# Patient Record
Sex: Male | Born: 1955 | Race: Black or African American | Hispanic: No | Marital: Single | Smoking: Former smoker
Health system: Southern US, Community
[De-identification: ages and names within clinical notes are randomized; demographics above are authoritative.]

## PROBLEM LIST (undated history)

## (undated) DIAGNOSIS — F419 Anxiety disorder, unspecified: Secondary | ICD-10-CM

## (undated) HISTORY — PX: APPENDECTOMY: SHX54

---

## 2008-11-02 ENCOUNTER — Emergency Department: Payer: Self-pay | Admitting: Emergency Medicine

## 2010-10-03 ENCOUNTER — Emergency Department: Payer: Self-pay | Admitting: Emergency Medicine

## 2012-07-21 ENCOUNTER — Inpatient Hospital Stay: Payer: Self-pay | Admitting: Surgery

## 2012-07-21 LAB — COMPREHENSIVE METABOLIC PANEL
Albumin: 3.3 g/dL — ABNORMAL LOW (ref 3.4–5.0)
Alkaline Phosphatase: 115 U/L (ref 50–136)
BUN: 19 mg/dL — ABNORMAL HIGH (ref 7–18)
Bilirubin,Total: 0.6 mg/dL (ref 0.2–1.0)
Calcium, Total: 9.7 mg/dL (ref 8.5–10.1)
Chloride: 97 mmol/L — ABNORMAL LOW (ref 98–107)
Co2: 29 mmol/L (ref 21–32)
Creatinine: 0.91 mg/dL (ref 0.60–1.30)
EGFR (Non-African Amer.): >60
Glucose: 153 mg/dL — ABNORMAL HIGH (ref 65–99)
Osmolality: 270 (ref 275–301)
Potassium: 2.9 mmol/L — ABNORMAL LOW (ref 3.5–5.1)
SGOT(AST): 20 U/L (ref 15–37)
SGPT (ALT): 69 U/L (ref 12–78)

## 2012-07-21 LAB — CBC
HCT: 41.7 % (ref 40.0–52.0)
HGB: 14.2 g/dL (ref 13.0–18.0)
MCH: 28.2 pg (ref 26.0–34.0)
MCHC: 34.2 g/dL (ref 32.0–36.0)
MCV: 83 fL (ref 80–100)
RBC: 5.04 10*6/uL (ref 4.40–5.90)
RDW: 14.8 % — ABNORMAL HIGH (ref 11.5–14.5)
WBC: 17.7 10*3/uL — ABNORMAL HIGH (ref 3.8–10.6)

## 2012-07-21 LAB — URINALYSIS, COMPLETE
Bilirubin,UR: NEGATIVE
Nitrite: NEGATIVE
Ph: 5 (ref 4.5–8.0)

## 2012-07-21 LAB — MAGNESIUM: Magnesium: 2.6 mg/dL — ABNORMAL HIGH

## 2012-07-21 LAB — TROPONIN I: Troponin-I: <0.02 ng/mL

## 2012-07-21 LAB — POTASSIUM: Potassium: 3.3 mmol/L — ABNORMAL LOW (ref 3.5–5.1)

## 2012-07-22 LAB — CBC WITH DIFFERENTIAL/PLATELET
Basophil %: 0.1 %
Eosinophil #: 0 10*3/uL (ref 0.0–0.7)
HCT: 41.2 % (ref 40.0–52.0)
Lymphocyte %: 7.6 %
MCH: 27.9 pg (ref 26.0–34.0)
MCV: 85 fL (ref 80–100)
Monocyte #: 0.7 x10 3/mm (ref 0.2–1.0)
Neutrophil #: 8.7 10*3/uL — ABNORMAL HIGH (ref 1.4–6.5)
Neutrophil %: 85.4 %
Platelet: 459 10*3/uL — ABNORMAL HIGH (ref 150–440)
RDW: 14.7 % — ABNORMAL HIGH (ref 11.5–14.5)

## 2012-07-22 LAB — BASIC METABOLIC PANEL
BUN: 15 mg/dL (ref 7–18)
Calcium, Total: 8.3 mg/dL — ABNORMAL LOW (ref 8.5–10.1)
Co2: 26 mmol/L (ref 21–32)
EGFR (Non-African Amer.): >60
Sodium: 138 mmol/L (ref 136–145)

## 2012-07-23 LAB — CBC WITH DIFFERENTIAL/PLATELET
Basophil %: 0.2 %
Eosinophil #: 0 10*3/uL (ref 0.0–0.7)
Eosinophil %: 0.1 %
HCT: 38.1 % — ABNORMAL LOW (ref 40.0–52.0)
HGB: 12.4 g/dL — ABNORMAL LOW (ref 13.0–18.0)
Lymphocyte #: 1.2 10*3/uL (ref 1.0–3.6)
Lymphocyte %: 7.8 %
MCV: 84 fL (ref 80–100)
Monocyte #: 1.1 x10 3/mm — ABNORMAL HIGH (ref 0.2–1.0)
Monocyte %: 7.2 %
Neutrophil #: 13.4 10*3/uL — ABNORMAL HIGH (ref 1.4–6.5)
RBC: 4.51 10*6/uL (ref 4.40–5.90)
RDW: 14.9 % — ABNORMAL HIGH (ref 11.5–14.5)
WBC: 15.8 10*3/uL — ABNORMAL HIGH (ref 3.8–10.6)

## 2012-07-23 LAB — BASIC METABOLIC PANEL
Calcium, Total: 8.5 mg/dL (ref 8.5–10.1)
Chloride: 101 mmol/L (ref 98–107)
EGFR (Non-African Amer.): >60
Osmolality: 277 (ref 275–301)
Potassium: 3.7 mmol/L (ref 3.5–5.1)
Sodium: 138 mmol/L (ref 136–145)

## 2012-07-25 LAB — CBC WITH DIFFERENTIAL/PLATELET
Basophil #: 0 10*3/uL (ref 0.0–0.1)
HCT: 34.6 % — ABNORMAL LOW (ref 40.0–52.0)
HGB: 11.5 g/dL — ABNORMAL LOW (ref 13.0–18.0)
Lymphocyte #: 1.2 10*3/uL (ref 1.0–3.6)
MCH: 27.9 pg (ref 26.0–34.0)
MCHC: 33.3 g/dL (ref 32.0–36.0)
MCV: 84 fL (ref 80–100)
Monocyte #: 0.7 x10 3/mm (ref 0.2–1.0)
Monocyte %: 5.1 %
Neutrophil #: 12.4 10*3/uL — ABNORMAL HIGH (ref 1.4–6.5)
Platelet: 359 10*3/uL (ref 150–440)
RBC: 4.13 10*6/uL — ABNORMAL LOW (ref 4.40–5.90)
WBC: 14.4 10*3/uL — ABNORMAL HIGH (ref 3.8–10.6)

## 2012-07-27 LAB — CBC WITH DIFFERENTIAL/PLATELET
Eosinophil #: 0.1 10*3/uL (ref 0.0–0.7)
HCT: 33.4 % — ABNORMAL LOW (ref 40.0–52.0)
HGB: 11.4 g/dL — ABNORMAL LOW (ref 13.0–18.0)
Lymphocyte %: 11.6 %
MCH: 28.5 pg (ref 26.0–34.0)
MCV: 84 fL (ref 80–100)
Monocyte #: 0.8 x10 3/mm (ref 0.2–1.0)
Monocyte %: 6.8 %
Neutrophil #: 9.5 10*3/uL — ABNORMAL HIGH (ref 1.4–6.5)
Platelet: 348 10*3/uL (ref 150–440)
RBC: 3.99 10*6/uL — ABNORMAL LOW (ref 4.40–5.90)
RDW: 14.5 % (ref 11.5–14.5)
WBC: 11.9 10*3/uL — ABNORMAL HIGH (ref 3.8–10.6)

## 2012-07-28 LAB — BASIC METABOLIC PANEL
Anion Gap: 7 (ref 7–16)
BUN: 6 mg/dL — ABNORMAL LOW (ref 7–18)
Calcium, Total: 8.6 mg/dL (ref 8.5–10.1)
Chloride: 101 mmol/L (ref 98–107)
Co2: 31 mmol/L (ref 21–32)
EGFR (African American): >60
Osmolality: 275 (ref 275–301)
Potassium: 3 mmol/L — ABNORMAL LOW (ref 3.5–5.1)
Sodium: 139 mmol/L (ref 136–145)

## 2012-07-28 LAB — CBC WITH DIFFERENTIAL/PLATELET
Basophil %: 0.4 %
HGB: 11.4 g/dL — ABNORMAL LOW (ref 13.0–18.0)
Lymphocyte %: 11.6 %
MCV: 83 fL (ref 80–100)
Monocyte #: 0.7 x10 3/mm (ref 0.2–1.0)
Monocyte %: 6 %
Neutrophil #: 9.6 10*3/uL — ABNORMAL HIGH (ref 1.4–6.5)
Neutrophil %: 81.7 %
Platelet: 376 10*3/uL (ref 150–440)
RBC: 4.09 10*6/uL — ABNORMAL LOW (ref 4.40–5.90)
WBC: 11.8 10*3/uL — ABNORMAL HIGH (ref 3.8–10.6)

## 2012-07-29 LAB — CLOSTRIDIUM DIFFICILE BY PCR

## 2012-07-30 LAB — CBC WITH DIFFERENTIAL/PLATELET
HCT: 32.8 % — ABNORMAL LOW (ref 40.0–52.0)
Lymphocyte #: 1.4 10*3/uL (ref 1.0–3.6)
Lymphocyte %: 16.8 %
MCH: 28.2 pg (ref 26.0–34.0)
Monocyte %: 7.6 %
Neutrophil #: 6 10*3/uL (ref 1.4–6.5)
Neutrophil %: 74 %
Platelet: 467 10*3/uL — ABNORMAL HIGH (ref 150–440)
RBC: 3.91 10*6/uL — ABNORMAL LOW (ref 4.40–5.90)
RDW: 14.8 % — ABNORMAL HIGH (ref 11.5–14.5)
WBC: 8.1 10*3/uL (ref 3.8–10.6)

## 2012-07-30 LAB — COMPREHENSIVE METABOLIC PANEL
Albumin: 2.3 g/dL — ABNORMAL LOW (ref 3.4–5.0)
Alkaline Phosphatase: 81 U/L (ref 50–136)
Bilirubin,Total: 0.5 mg/dL (ref 0.2–1.0)
Calcium, Total: 8.3 mg/dL — ABNORMAL LOW (ref 8.5–10.1)
Chloride: 102 mmol/L (ref 98–107)
Co2: 30 mmol/L (ref 21–32)
Creatinine: 0.68 mg/dL (ref 0.60–1.30)
EGFR (Non-African Amer.): >60
Glucose: 90 mg/dL (ref 65–99)
Osmolality: 279 (ref 275–301)
Potassium: 2.7 mmol/L — ABNORMAL LOW (ref 3.5–5.1)
SGOT(AST): 19 U/L (ref 15–37)

## 2012-07-31 LAB — BASIC METABOLIC PANEL
Chloride: 106 mmol/L (ref 98–107)
Co2: 28 mmol/L (ref 21–32)
Creatinine: 0.63 mg/dL (ref 0.60–1.30)
EGFR (African American): >60
EGFR (Non-African Amer.): >60
Glucose: 104 mg/dL — ABNORMAL HIGH (ref 65–99)
Potassium: 3.3 mmol/L — ABNORMAL LOW (ref 3.5–5.1)
Sodium: 142 mmol/L (ref 136–145)

## 2012-07-31 LAB — CBC WITH DIFFERENTIAL/PLATELET
Basophil #: 0 10*3/uL (ref 0.0–0.1)
HCT: 34.8 % — ABNORMAL LOW (ref 40.0–52.0)
Monocyte %: 6.7 %
Neutrophil #: 6.4 10*3/uL (ref 1.4–6.5)
Platelet: 549 10*3/uL — ABNORMAL HIGH (ref 150–440)

## 2012-08-03 LAB — CBC WITH DIFFERENTIAL/PLATELET
Basophil #: 0 10*3/uL (ref 0.0–0.1)
Basophil %: 0.5 %
Eosinophil #: 0.1 10*3/uL (ref 0.0–0.7)
Eosinophil %: 0.7 %
HCT: 31.7 % — ABNORMAL LOW (ref 40.0–52.0)
HGB: 10.6 g/dL — ABNORMAL LOW (ref 13.0–18.0)
Lymphocyte %: 12.4 %
MCH: 27.8 pg (ref 26.0–34.0)
MCHC: 33.3 g/dL (ref 32.0–36.0)
MCV: 84 fL (ref 80–100)
Monocyte %: 6.3 %
Neutrophil #: 7.6 10*3/uL — ABNORMAL HIGH (ref 1.4–6.5)
Neutrophil %: 80.1 %
Platelet: 530 10*3/uL — ABNORMAL HIGH (ref 150–440)
RDW: 14.5 % (ref 11.5–14.5)
WBC: 9.4 10*3/uL (ref 3.8–10.6)

## 2012-08-04 LAB — BASIC METABOLIC PANEL
Anion Gap: 7 (ref 7–16)
BUN: 2 mg/dL — ABNORMAL LOW (ref 7–18)
Calcium, Total: 8.2 mg/dL — ABNORMAL LOW (ref 8.5–10.1)
Co2: 31 mmol/L (ref 21–32)
Creatinine: 0.69 mg/dL (ref 0.60–1.30)
EGFR (African American): >60
EGFR (Non-African Amer.): >60
Glucose: 99 mg/dL (ref 65–99)

## 2012-08-04 LAB — MAGNESIUM: Magnesium: 1.3 mg/dL — ABNORMAL LOW

## 2012-08-05 LAB — URINALYSIS, COMPLETE
Bacteria: NONE SEEN
Bilirubin,UR: NEGATIVE
Glucose,UR: NEGATIVE mg/dL (ref 0–75)
Leukocyte Esterase: NEGATIVE
Nitrite: NEGATIVE
Protein: 30
RBC,UR: 6 /HPF (ref 0–5)
Specific Gravity: 1.014 (ref 1.003–1.030)

## 2012-08-05 LAB — BASIC METABOLIC PANEL
Anion Gap: 5 — ABNORMAL LOW (ref 7–16)
BUN: 2 mg/dL — ABNORMAL LOW (ref 7–18)
Chloride: 104 mmol/L (ref 98–107)
EGFR (African American): >60
EGFR (Non-African Amer.): >60
Glucose: 96 mg/dL (ref 65–99)
Osmolality: 277 (ref 275–301)
Potassium: 2.8 mmol/L — ABNORMAL LOW (ref 3.5–5.1)
Sodium: 141 mmol/L (ref 136–145)

## 2012-08-05 LAB — CLOSTRIDIUM DIFFICILE BY PCR

## 2012-08-05 LAB — PHOSPHORUS: Phosphorus: 2.2 mg/dL — ABNORMAL LOW (ref 2.5–4.9)

## 2012-08-06 LAB — CBC WITH DIFFERENTIAL/PLATELET
Basophil #: 0 10*3/uL (ref 0.0–0.1)
Basophil %: 1.1 %
Eosinophil #: 0.1 10*3/uL (ref 0.0–0.7)
Eosinophil %: 3 %
HCT: 33.5 % — ABNORMAL LOW (ref 40.0–52.0)
Lymphocyte #: 1.7 10*3/uL (ref 1.0–3.6)
Lymphocyte %: 35.7 %
MCHC: 33.8 g/dL (ref 32.0–36.0)
MCV: 84 fL (ref 80–100)
Monocyte #: 0.5 x10 3/mm (ref 0.2–1.0)
Neutrophil #: 2.3 10*3/uL (ref 1.4–6.5)
Neutrophil %: 48.7 %
Platelet: 566 10*3/uL — ABNORMAL HIGH (ref 150–440)
RBC: 4 10*6/uL — ABNORMAL LOW (ref 4.40–5.90)
WBC: 4.6 10*3/uL (ref 3.8–10.6)

## 2012-08-06 LAB — BASIC METABOLIC PANEL
Anion Gap: 6 — ABNORMAL LOW (ref 7–16)
BUN: 4 mg/dL — ABNORMAL LOW (ref 7–18)
Calcium, Total: 8.8 mg/dL (ref 8.5–10.1)
Co2: 29 mmol/L (ref 21–32)
EGFR (African American): >60
EGFR (Non-African Amer.): >60
Potassium: 3.3 mmol/L — ABNORMAL LOW (ref 3.5–5.1)
Sodium: 142 mmol/L (ref 136–145)

## 2012-08-15 ENCOUNTER — Ambulatory Visit: Payer: Self-pay | Admitting: Surgery

## 2012-08-15 LAB — BASIC METABOLIC PANEL WITH GFR
Anion Gap: 11
BUN: 10 mg/dL
Calcium, Total: 9.6 mg/dL
Chloride: 103 mmol/L
Co2: 32 mmol/L
Creatinine: 0.79 mg/dL
EGFR (African American): >60
EGFR (Non-African Amer.): >60
Glucose: 101 mg/dL — ABNORMAL HIGH
Osmolality: 290
Potassium: 2.8 mmol/L — ABNORMAL LOW
Sodium: 146 mmol/L — ABNORMAL HIGH

## 2012-08-15 LAB — CBC WITH DIFFERENTIAL/PLATELET
Basophil #: 0 10*3/uL (ref 0.0–0.1)
Basophil %: 0.3 %
Eosinophil %: 1.2 %
HGB: 12.1 g/dL — ABNORMAL LOW (ref 13.0–18.0)
Lymphocyte #: 1.6 10*3/uL (ref 1.0–3.6)
Lymphocyte %: 23.7 %
MCH: 27.8 pg (ref 26.0–34.0)
MCHC: 33.1 g/dL (ref 32.0–36.0)
MCV: 84 fL (ref 80–100)
Monocyte %: 7.9 %
Platelet: 441 10*3/uL — ABNORMAL HIGH (ref 150–440)
RDW: 14.9 % — ABNORMAL HIGH (ref 11.5–14.5)
WBC: 6.8 10*3/uL (ref 3.8–10.6)

## 2012-08-19 ENCOUNTER — Ambulatory Visit: Payer: Self-pay | Admitting: Surgery

## 2014-03-12 ENCOUNTER — Ambulatory Visit: Payer: Self-pay

## 2014-06-01 NOTE — Op Note (Signed)
PATIENT NAME:  William RichterJEFFERS, Tallin L MR#:  [id number] DATE OF BIRTH:  Nov 22, 1955  DATE OF PROCEDURE:  07/21/2012  PREOPERATIVE DIAGNOSIS:  Acute appendicitis.   POSTOPERATIVE DIAGNOSIS:  Acute ruptured appendicitis.   SURGEON:  Quentin Orealph L. Ely, M.D.   ANESTHESIA:  General.   OPERATIVE PROCEDURE:  Laparoscopic appendectomy.   PROCEDURE IN DETAIL:  With the patient in supine position after induction of appropriate general anesthesia, the patient's abdomen was prepped with ChloraPrep and draped with sterile towels.  The patient was placed in the head down, feet up position.  A very small infraumbilical incision was made in the standard fashion and carried down bluntly through the subcutaneous tissue.  A Veress needle was used to cannulate the peritoneal cavity.  CO2 was insufflated to appropriate pressure measurements.  When approximately 2.5 liters of CO2 were instilled, the Veress needle was withdrawn.  An 11 mm [employer] ports inserted into the peritoneal cavity.  The appropriate position was confirmed.  CO2 was reinsufflated.  The patient was placed in the head down, feet up position and a transverse upper abdominal incision was made and an 11 mm port was inserted under direct vision.  The right lower quadrant was investigated.  There was a large abscess which was encountered in the right lower quadrant.  A transverse incision was made and a 12 mm port was inserted under direct vision.  The camera was moved to the upper port, dissection carried out through the two lower ports.  The area was copiously irrigated and suctioned.  A large amount of purulence was encountered.  Tedious dissection was required to expose the appendix which appeared to have ruptured at its base shearing off the majority of the appendix.  A portion of the mesoappendix was identified and divided with the Endo GIA stapler carrying a white load and then the base of the appendix was visualized.  It was very difficult to say in the  debris exactly where the stump was, but I stapled across what I believed to be the base of the appendix.  The appendix was captured in an Endo Catch apparatus and removed.  The area was then irrigated with 5 liters of warm saline solution.  A separate stab was made in the right lower quadrant and a JP drain inserted, placed in the gutter and brought out through that incision.  It was secured with 3-0 nylon.  The abdomen was desufflated.  The midline fascia was closed with figure-of-eight suture of 0 Vicryl and skin was closed with 5-0 nylon.  The area was infiltrated with 0.25% Marcaine for postoperative pain control.  Sterile dressings were applied.  The patient was returned to the recovery room having tolerated the procedure well.  Sponge, instrument and needle counts were correct x 2 in the operating room.     ____________________________ Quentin Orealph L. Ely III, MD rle:ea D: 07/22/2012 00:48:00 ET T: 07/22/2012 04:20:12 ET JOB#: [id number]  cc: Quentin Orealph L. Ely III, MD, <Dictator> Quentin OreALPH L ELY MD ELECTRONICALLY SIGNED 07/29/2012 20:12

## 2014-06-01 NOTE — Discharge Summary (Signed)
PATIENT NAME:  William Banks, William Banks MR#:  [id number] DATE OF BIRTH:  12-04-1955  DATE OF ADMISSION:  07/21/2012 DATE OF DISCHARGE:  08/07/2012  BRIEF HISTORY: Mr. Maudry MayhewDallas Resnik is a 59 year old gentleman admitted through the Emergency Room with what appeared to be a ruptured appendicitis. The patient had been having symptoms for several days at the time of admission. He was taken to surgery that evening where he underwent surgical laparoscopy. He had acute ruptured appendicitis with a large pocket of purulence and a necrotic appendix. It was very difficult to identify the stump of the appendix, but I believe I stapled the base of the appendix. The appendix was removed. The abdomen irrigated with 5 liters of warm saline solution. A JP drain was placed in the gutter along the right colon. He had very slow return of bowel function with a prolonged ileus. He began to develop some drainage from the JP drain which appeared to be feculent, and there was concern about the development of a fecal fistula. The drain was left in place. He slowly began to recover his bowel function. He was able to tolerate a regular diet. The drainage cleared to more purulent looking material and the patient was prepared for discharge. He did have some liquid stool prior to discharge and a Clostridium difficile titer was negative. He was discharged home on the 29th to be followed in the office in 7 to 10 days' time for drain removal. His discharge medications include Augmentin 875 twice a day for five days, Vicodin every six hours 5/325 p.r.n. and  potassium 20 mEq once a day.   FINAL DISCHARGE DIAGNOSIS: Acute ruptured appendicitis.   SURGERY: Laparoscopic appendectomy.  ____________________________ Quentin Orealph Banks. Ely III, MD rle:dp D: 08/23/2012 03:55:00 ET T: 08/23/2012 07:03:56 ET JOB#: [id number]  cc: Carmie Endalph Banks. Ely III, MD, <Dictator> Quentin OreALPH Banks ELY MD ELECTRONICALLY SIGNED 08/23/2012 19:37

## 2014-06-01 NOTE — H&P (Signed)
Subjective/Chief Complaint lower abd pain   History of Present Illness 2-3 day Hx of bilat lower quadrant pain, nausea, emesis; nml BM no melena, no hematochezia no dysuria ate today about 10 am no f/c no prior episode   Past History none PSH inknown umbilical? procedure as baby ?hernia?   Past Med/Surgical Hx:  denies:   denies:   ALLERGIES:  No Known Allergies:   Family and Social History:  Family History Non-Contributory   Social History negative tobacco, positive ETOH, stopped tobacco years ago; not employed   Warden/ranger of [employer]   Review of Systems:  Fever/Chills No   Cough No   Abdominal Pain Yes   Diarrhea No   Constipation No   Nausea/Vomiting Yes   SOB/DOE No   Chest Pain No   Dysuria No   Tolerating Diet No  Nauseated  Vomiting  but ate a sandwich this am   Medications/Allergies Reviewed Medications/Allergies reviewed   Physical Exam:  GEN disheveled, dry, fetid breath   HEENT pink conjunctivae   NECK supple   RESP normal resp effort  clear BS   CARD regular rate   ABD positive tenderness  soft  distended  tender bilat LQ max in RLQ/McBurney's point; guarding, perc tenderness   LYMPH negative neck   EXTR negative edema   SKIN normal to palpation   PSYCH alert, A+O to time, place, person   Lab Results: Hepatic:  12-Jun-14 08:47   Bilirubin, Total 0.6  Alkaline Phosphatase 115  SGPT (ALT) 69  SGOT (AST) 20  Total Protein, Serum  9.1  Albumin, Serum  3.3  Routine Chem:  12-Jun-14 08:47   Magnesium, Serum  2.6 (1.8-2.4 THERAPEUTIC RANGE: 4-7 mg/dL TOXIC: > 10 mg/dL  -----------------------)  Glucose, Serum  153  BUN  19  Creatinine (comp) 0.91  Sodium, Serum  132  Potassium, Serum  2.9  Chloride, Serum  97  CO2, Serum 29  Calcium (Total), Serum 9.7  Osmolality (calc) 270  eGFR (African American) >60  eGFR (Non-African American) >60 (eGFR values <55m/min/1.73 m2 may be an indication of chronic kidney disease  (CKD). Calculated eGFR is useful in patients with stable renal function. The eGFR calculation will not be reliable in acutely ill patients when serum creatinine is changing rapidly. It is not useful in  patients on dialysis. The eGFR calculation may not be applicable to patients at the low and high extremes of body sizes, pregnant women, and vegetarians.)  Anion Gap  6  Lipase 128 (Result(s) reported on 21 Jul 2012 at 09:13AM.)  Cardiac:  12-Jun-14 08:47   Troponin I < 0.02 (0.00-0.05 0.05 ng/mL or less: NEGATIVE  Repeat testing in 3-6 hrs  if clinically indicated. >0.05 ng/mL: POTENTIAL  MYOCARDIAL INJURY. Repeat  testing in 3-6 hrs if  clinically indicated. NOTE: An increase or decrease  of 30% or more on serial  testing suggests a  clinically important change)  Routine UA:  12-Jun-14 08:47   Color (UA) Amber  Clarity (UA) Hazy  Glucose (UA) Negative  Bilirubin (UA) Negative  Ketones (UA) Trace  Specific Gravity (UA) 1.028  Blood (UA) 2+  pH (UA) 5.0  Protein (UA) 100 mg/dL  Nitrite (UA) Negative  Leukocyte Esterase (UA) Negative (Result(s) reported on 21 Jul 2012 at 09:58AM.)  RBC (UA) 24 /HPF  WBC (UA) 4 /HPF  Bacteria (UA) TRACE  Epithelial Cells (UA) NONE SEEN  Mucous (UA) PRESENT (Result(s) reported on 21 Jul 2012 at 09:58AM.)  Routine Hem:  12-Jun-14 08:47   WBC (CBC)  17.7  RBC (CBC) 5.04  Hemoglobin (CBC) 14.2  Hematocrit (CBC) 41.7  Platelet Count (CBC)  554 (Result(s) reported on 21 Jul 2012 at 09:16AM.)  MCV 83  MCH 28.2  MCHC 34.2  RDW  14.8   Radiology Results: CT:    12-Jun-14 12:46, CT Abdomen and Pelvis With Contrast  CT Abdomen and Pelvis With Contrast  REASON FOR EXAM:    (1) LLQ pain, leukocytosis; (2) LLQ pain, leukocytosis  COMMENTS:       PROCEDURE: CT  - CT ABDOMEN / PELVIS  W  - Jul 21 2012 12:46PM     RESULT: Axial CT scanning was performed through the abdomen and pelvis   following intravenous administration of 100 cc of  Isovue-370 as well as   oral contrast material. Review of multiplanar reconstructed images was   performed separately on the VIA monitor.    There is moderate distention of loops of small bowel with gas and fluid.   This appears to be secondary to a process in the right lower quadrant of   the abdomen. There is increased density in the pericolonic fat and poor   definition of the bowel wall. There is a structure that may reflect an   inflamed appendix demonstrated on images 82 through 96. There appears to     be a small amount of extraluminal fluid just above the presumably   inflamed appendix. The ascending and transverse colon portions of the   colon contains some gas no significant fluid. The descending colonis   largely collapsed. The rectosigmoid contains a moderate amount of gas and   some liquid stool is noted in the rectum.     The gallbladder is contracted. The liver, spleen, moderately distended   stomach, pancreas, and adrenal glands are normal in appearance. There are   multiple hypodensities in the left kidney most compatible with cysts.   Neither kidney exhibits evidence of stones or obstruction. The caliber of   the abdominal aorta is normal. The partially distended urinary bladder is   normal in appearance. The prostate gland is enlarged and produces a mild   impression upon the urinary bladder base. There is no significant   inguinal hernia. There is a broadly necked umbilical hernia. The lumbar   vertebral bodies are preserved in height. The lung bases exhibit no     infiltrates. There is minimal pleural thickening on the left.    IMPRESSION:   1. There is an inflammatory process in the right lower quadrant of the   abdomen and upper pelvis. This may reflect acute appendicitis but focal   diverticulitis could present in this fashion in the appropriate clinical   setting. A definite mass is not demonstrated here. There may be contained   perforation but portions of  the intact appendix are demonstrated as well.   An appendicolith is likely present on image 63.  2. The small bowel is moderately distended with gas and fluid in a   fashion consistent with an ileus or fairly high-grade distal obstruction.  3. The remainder of the colon exhibits a relatively nonspecific gas   pattern. There is some liquid stool in the rectum.  4. There is no acute hepatobiliary abnormality. The gallbladder is   contracted.  5. There are likely cysts in the left kidney but there is no evidence of   urinary tract obstruction.    The findingswere discussed by me by telephone with Dr.  Jasmine December in the   emergency department at the conclusion of the study.     Dictation Site: 1        Verified By: DAVID A. Martinique, M.D., MD    Assessment/Admission Diagnosis CT personally rev'd; suspect that pt has ruptured appendix with fluid in retroperitoneal position. Abn electrolytes need replacement and pt ate this am. n/v. will admit, start abx and aggressively fluid resuscitate and replace K 2.9. will need laparoscopy possible open appy discussed options risks and rationale agrees with plan   Electronic Signatures: Florene Glen (MD)  (Signed 12-Jun-14 16:06)  Authored: CHIEF COMPLAINT and HISTORY, PAST MEDICAL/SURGIAL HISTORY, ALLERGIES, FAMILY AND SOCIAL HISTORY, REVIEW OF SYSTEMS, PHYSICAL EXAM, LABS, Radiology, ASSESSMENT AND PLAN   Last Updated: 12-Jun-14 16:06 by Florene Glen (MD)

## 2014-06-01 NOTE — H&P (Signed)
PATIENT NAME:  William Banks, William Banks MR#:  [id number] DATE OF BIRTH:  02-09-1956  DATE OF ADMISSION:  07/21/2012  CHIEF COMPLAINT: Lower abdominal pain.   HISTORY OF PRESENT ILLNESS: This is a patient with 2 to 3 day history of lower abdominal pain. He has been vague or difficult to determine where this pain started. He had told the triage nurse that was on the left and he told the ER doctor that it was on the right, but on questioning and on exam his pain his in both lower quadrants, maximal in the right side.   He vomited multiple times yesterday. He has had no fevers or chills. His pain is gradually worsening. He denies back pain and has had no dysuria, melena or hematochezia. Has never had an episode like this before. He ate a sandwich this morning approximately, at 10:00 a.m. in spite of been nauseated and vomiting yesterday.   PAST MEDICAL HISTORY: None, although the patient does not see physicians.   PAST SURGICAL HISTORY: Some sort of umbilical procedure performed as a baby, suspect umbilical herniorrhaphy as a baby.    ALLERGIES: None.   MEDICATIONS: None.   FAMILY HISTORY: Noncontributory.   SOCIAL HISTORY: The patient is currently unemployed. 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 He does not smoke. Has not smoked for several years. He does drink alcohol on weekends.   REVIEW OF SYSTEMS:  A 10-system review was performed and negative with the exception of that mentioned in the history of present illness.   PHYSICAL EXAMINATION: GENERAL: In general the patient appears disheveled. He has a fetid breath.   VITAL SIGNS: Temperature is 98.4, pulse 90, respirations 18, blood pressure 130/78, 96% room air sat. Percentage and pain scale of 5.  HEENT: No scleral icterus.  NECK: No palpable neck nodes.  CHEST: Clear to auscultation. Minimal rhonchi at both bases.  CARDIAC: Regular rate and rhythm.  ABDOMEN: Obese, distended, tympanitic, tender in both lower quadrants, maximal in the right  lower quadrant with guarding and percussion tenderness and some rebound tenderness in the right lower quadrant. He  also has some tenderness in the left lower quadrant, but more in the right.  EXTREMITIES: Without edema.  NEUROLOGIC: Grossly intact.  INTEGUMENT: No jaundice.   LABORATORY VALUES: Demonstrate a white blood cell count is 17.7, hemoglobin and hematocrit of 14 and 42 and a platelet count of 554.   Electrolytes show a normal lipase, serum magnesium of 2.6, glucose of 153, BUN 19, creatinine 0.91, sodium is 132, potassium of 2.9, chloride of 97, CO2 of 29 and an albumin of 3.3.   CT scan is personally reviewed. In my opinion, the patient likely has a ruptured appendix. The reading suggests that there is an inflammatory process in the right lower quadrant, which may reflect acute appendicitis; however, in my review, it appears that there is fluid in the area behind the cecum. There intense inflammation in this area and it extends up the paracolic gutter suggestive of probable ruptured appendix.   ASSESSMENT AND PLAN: This is a patient with a probable ruptured appendix, although this could be simple appendicitis in the sense that the patient has only been sick for 2 days, but my feeling is that this patient may have been ill longer. He has a very fetid breath and appears dry. He vomited yesterday, ate today and has a grossly abnormal electrolytes. My plan is to admit the patient to the hospital, start aggressive fluid resuscitation and IV antibiotics and take  him to the operating room later this afternoon or evening, once he is adequately resuscitated. I will replace his potassium as well.  This plan was discussed with him. He understood and agreed to proceed. We also discussed the options and rationale and the risks of bleeding, infection and conversion to an open procedure from a laparoscopic approach and the  risk of recurrent abscess and the fact that this is likely were already ruptured. He  understood and agreed to proceed.    ____________________________ Adah Salvageichard E. Excell Seltzerooper, MD rec:cc D: 07/21/2012 16:12:31 ET T: 07/21/2012 16:38:47 ET JOB#: [id number]  cc: Adah Salvageichard E. Excell Seltzerooper, MD, <Dictator> Lattie HawICHARD E Acquanetta Cabanilla MD ELECTRONICALLY SIGNED 07/23/2012 11:22

## 2019-12-29 ENCOUNTER — Other Ambulatory Visit: Payer: Self-pay

## 2019-12-29 ENCOUNTER — Encounter: Payer: Self-pay | Admitting: Emergency Medicine

## 2019-12-29 ENCOUNTER — Emergency Department: Payer: Self-pay

## 2019-12-29 ENCOUNTER — Emergency Department
Admission: EM | Admit: 2019-12-29 | Discharge: 2019-12-29 | Disposition: A | Discharge status: Home / Self Care | Payer: Self-pay | Attending: Emergency Medicine | Admitting: Emergency Medicine

## 2019-12-29 DIAGNOSIS — Z87891 Personal history of nicotine dependence: Secondary | ICD-10-CM | POA: Insufficient documentation

## 2019-12-29 DIAGNOSIS — R109 Unspecified abdominal pain: Secondary | ICD-10-CM | POA: Insufficient documentation

## 2019-12-29 DIAGNOSIS — R110 Nausea: Secondary | ICD-10-CM | POA: Insufficient documentation

## 2019-12-29 DIAGNOSIS — R1084 Generalized abdominal pain: Secondary | ICD-10-CM

## 2019-12-29 DIAGNOSIS — K5900 Constipation, unspecified: Secondary | ICD-10-CM | POA: Insufficient documentation

## 2019-12-29 HISTORY — DX: Anxiety disorder, unspecified: F41.9

## 2019-12-29 LAB — COMPREHENSIVE METABOLIC PANEL
ALT: 21 U/L (ref 0–44)
AST: 16 U/L (ref 15–41)
Albumin: 4.2 g/dL (ref 3.5–5.0)
Alkaline Phosphatase: 71 U/L (ref 38–126)
Anion gap: 12 (ref 5–15)
BUN: 14 mg/dL (ref 8–23)
CO2: 25 mmol/L (ref 22–32)
Calcium: 9.2 mg/dL (ref 8.9–10.3)
Chloride: 102 mmol/L (ref 98–111)
Creatinine, Ser: 0.78 mg/dL (ref 0.61–1.24)
GFR, Estimated: >60 mL/min (ref >60)
Glucose, Bld: 111 mg/dL — ABNORMAL HIGH (ref 70–99)
Potassium: 3.4 mmol/L — ABNORMAL LOW (ref 3.5–5.1)
Sodium: 139 mmol/L (ref 135–145)
Total Bilirubin: 0.5 mg/dL (ref 0.3–1.2)
Total Protein: 8.1 g/dL (ref 6.5–8.1)

## 2019-12-29 LAB — URINALYSIS, COMPLETE (UACMP) WITH MICROSCOPIC
Bacteria, UA: NONE SEEN
Bilirubin Urine: NEGATIVE
Glucose, UA: NEGATIVE mg/dL
Ketones, ur: NEGATIVE mg/dL
Leukocytes,Ua: NEGATIVE
Nitrite: NEGATIVE
Protein, ur: 30 mg/dL — AB
Specific Gravity, Urine: 1.026 (ref 1.005–1.030)
pH: 5 (ref 5.0–8.0)

## 2019-12-29 LAB — CBC
HCT: 42.5 % (ref 39.0–52.0)
Hemoglobin: 13.9 g/dL (ref 13.0–17.0)
MCH: 28.7 pg (ref 26.0–34.0)
MCHC: 32.7 g/dL (ref 30.0–36.0)
MCV: 87.6 fL (ref 80.0–100.0)
Platelets: 419 10*3/uL — ABNORMAL HIGH (ref 150–400)
RBC: 4.85 MIL/uL (ref 4.22–5.81)
RDW: 14.3 % (ref 11.5–15.5)
WBC: 6.2 10*3/uL (ref 4.0–10.5)
nRBC: 0 % (ref 0.0–0.2)

## 2019-12-29 LAB — LIPASE, BLOOD: Lipase: 29 U/L (ref 11–51)

## 2019-12-29 MED ORDER — IOHEXOL 300 MG/ML  SOLN
100.0000 mL | Freq: Once | INTRAMUSCULAR | Status: AC | PRN
  Administered 2019-12-29: 100 mL via INTRAVENOUS

## 2019-12-29 MED ORDER — IOHEXOL 9 MG/ML PO SOLN
500.0000 mL | Freq: Once | ORAL | Status: AC
  Administered 2019-12-29: 500 mL via ORAL

## 2019-12-29 MED ORDER — POLYETHYLENE GLYCOL 3350 17 GM/SCOOP PO POWD: 17.0000 g | Freq: Every day | ORAL | 0 refills | Status: AC | PRN

## 2019-12-29 MED ORDER — DICYCLOMINE HCL 10 MG PO CAPS: 10.0000 mg | ORAL_CAPSULE | Freq: Three times a day (TID) | ORAL | 0 refills | Status: AC | PRN

## 2019-12-29 NOTE — ED Provider Notes (Signed)
[facility] Emergency Department Provider Note  Time seen: 8:23 AM  I have reviewed the triage vital signs and the nursing notes.   HISTORY  Chief Complaint Abdominal Pain   HPI William Banks is a 64 y.o. male with a past medical history of anxiety presents to the emergency department for abdominal pain.  According to the patient since yesterday he has been experiencing vague abdominal discomfort throughout his abdomen, he is also feeling like he needs to have a bowel movement but is unable to do so, states nausea but denies vomiting.  No fever.  Patient states his abdomen feels like it is distended since yesterday as well.  He is concerned that he could have a "bowel blockage."   Past Medical History:  Diagnosis Date  . Anxiety     There are no problems to display for this patient.   Prior to Admission medications   Not on File    Allergies  Allergen Reactions  . Bee Venom Swelling    No family history on file.  Social History Social History   Tobacco Use  . Smoking status: Former Games developer  . Smokeless tobacco: Never Used  Substance Use Topics  . Alcohol use: Not on file  . Drug use: Not on file    Review of Systems Constitutional: Negative for fever Cardiovascular: Negative for chest pain. Respiratory: Negative for shortness of breath. Gastrointestinal: Mild dull vague abdominal discomfort.  Positive for nausea.  Negative for vomiting or diarrhea.  Positive for constipation since yesterday. Genitourinary: Negative for urinary compaints Musculoskeletal: Negative for musculoskeletal complaints Neurological: Negative for headache All other ROS negative  ____________________________________________   PHYSICAL EXAM:  VITAL SIGNS: ED Triage Vitals  Enc Vitals Group     BP 12/29/19 0733 (!) 152/82     Pulse Rate 12/29/19 0733 71     Resp 12/29/19 0733 18     Temp 12/29/19 0733 98.9 F (37.2 C)     Temp Source 12/29/19 0733 Oral      SpO2 12/29/19 0733 97 %     Weight 12/29/19 0734 215 lb (97.5 kg)     Height 12/29/19 0734 5\' 7"  (1.702 m)     Head Circumference --      Peak Flow --      Pain Score 12/29/19 0738 5     Pain Loc --      Pain Edu? --      Excl. in GC? --    Constitutional: Alert and oriented. Well appearing and in no distress. Eyes: Normal exam ENT      Head: Normocephalic and atraumatic.      Mouth/Throat: Mucous membranes are moist. Cardiovascular: Normal rate, regular rhythm. Respiratory: Normal respiratory effort without tachypnea nor retractions. Breath sounds are clear  Gastrointestinal: Soft, mild distention but overall dull percussion.  Very slight but diffuse abdominal tenderness without focal tenderness identified.  No rebound guarding. Musculoskeletal: Nontender with normal range of motion in all extremities. Neurologic:  Normal speech and language. No gross focal neurologic deficits  Skin:  Skin is warm, dry and intact.  Psychiatric: Mood and affect are normal.    ____________________________________________   RADIOLOGY  CT negative for acute abnormality  ____________________________________________   INITIAL IMPRESSION / ASSESSMENT AND PLAN / ED COURSE  Pertinent labs & imaging results that were available during my care of the patient were reviewed by me and considered in my medical decision making (see chart for details).   Patient presents to  the emergency department for vague abdominal discomfort more so in the lower abdomen.  Also states constipation since yesterday.  Patient's lab work is largely within normal limits besides slight hematuria however given the patient's complaint of abdominal distention constipation and mild tenderness throughout we will obtain a CT scan to further evaluate.  Patient agreeable to plan of care.  CT scan is negative for acute abnormality.  Will discharge on MiraLAX and Bentyl I will have the patient follow-up with his doctor.  Patient agreeable  to plan of care.  William Banks was evaluated in Emergency Department on 12/29/2019 for the symptoms described in the history of present illness. He was evaluated in the context of the global COVID-19 pandemic, which necessitated consideration that the patient might be at risk for infection with the SARS-CoV-2 virus that causes COVID-19. Institutional protocols and algorithms that pertain to the evaluation of patients at risk for COVID-19 are in a state of rapid change based on information released by regulatory bodies including the CDC and federal and state organizations. These policies and algorithms were followed during the patient's care in the ED.  ____________________________________________   FINAL CLINICAL IMPRESSION(S) / ED DIAGNOSES  Abdominal pain Constipation   Minna Antis, MD 12/29/19 1059

## 2019-12-29 NOTE — ED Triage Notes (Signed)
Pt to ER states he could not use the bathroom today. Pt states he has concerns that he has a blocked bowel.  Pt reports last BM was yesterday, but does not feel like it was a complete BM.

## 2019-12-29 NOTE — ED Notes (Signed)
Patient alert and oriented. Ambulatory with no issues. Ready for DC verbalized understanding.

## 2019-12-29 NOTE — ED Notes (Signed)
Patient transported to CT 

## 2019-12-29 NOTE — ED Triage Notes (Signed)
First RN Note: pt to ED via [facility] with c/o constipation, per EMS pt "could not poop to his fullest potential", EMS also reports 1 episode of vomiting with very light blood noted in blood.   HR 80 NSR 160/80 98% RA

## 2021-05-28 DIAGNOSIS — H401133 Primary open-angle glaucoma, bilateral, severe stage: Secondary | ICD-10-CM | POA: Diagnosis not present

## 2021-05-28 DIAGNOSIS — H2513 Age-related nuclear cataract, bilateral: Secondary | ICD-10-CM | POA: Diagnosis not present

## 2021-06-21 IMAGING — CT CT ABD-PELV W/ CM
2 of 5 series · 17 of 46 positions shown, 19 images · IV contrast (omnipaque)
Comparison: None.

CLINICAL DATA: Abdominal pain. No bowel movement. Concern for bowel
obstruction.

EXAM:
CT ABDOMEN AND PELVIS WITH CONTRAST
TECHNIQUE: Multidetector CT imaging of the abdomen and pelvis was performed
using the standard protocol following bolus administration of
intravenous contrast.
CONTRAST:  100mL OMNIPAQUE IOHEXOL 300 MG/ML  SOLN

[Series 2: routine abd/pel with · axial · 0.97mm/px · z∈[-695,-315]mm · 14 of 88 slices shown, 16 images]
[im 6/88  soft-tissue]
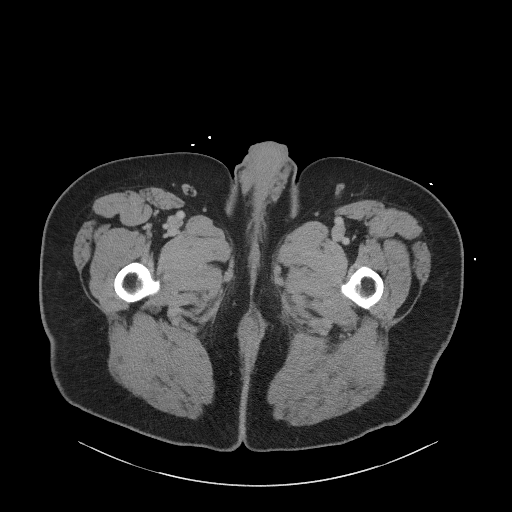
[im 6/88  bone]
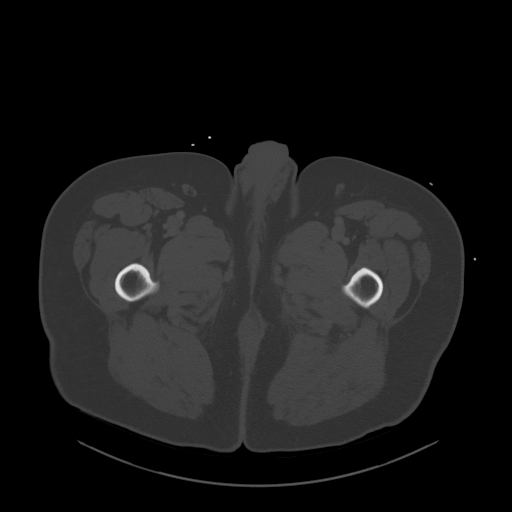
[im 11/88  soft-tissue]
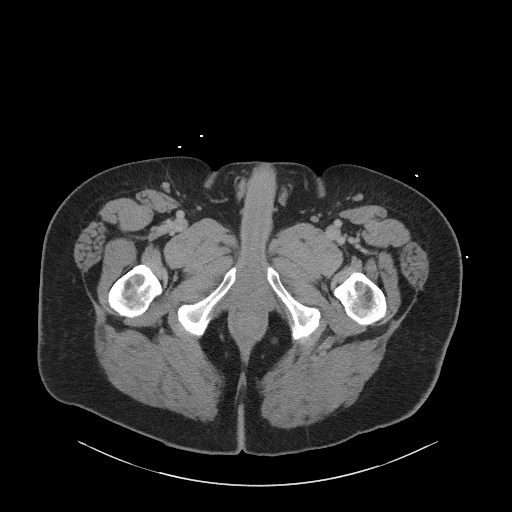
[im 16/88  soft-tissue]
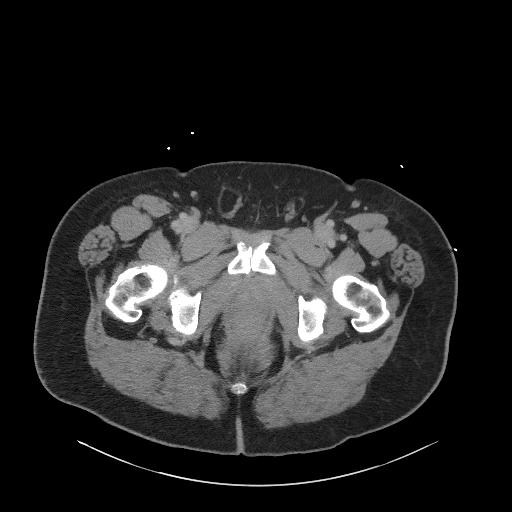
[im 26/88  soft-tissue]
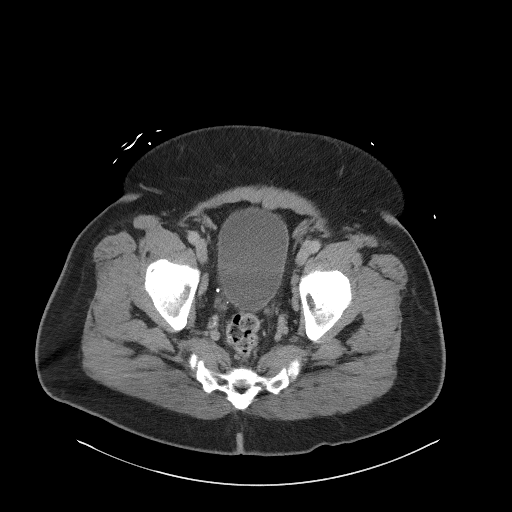
[im 31/88  soft-tissue]
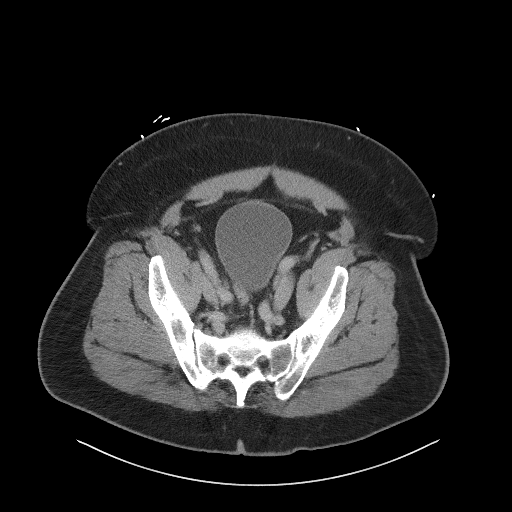
[im 36/88  soft-tissue]
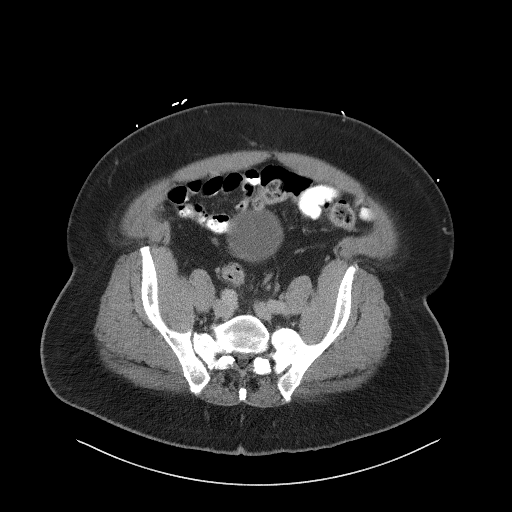
[im 41/88  soft-tissue]
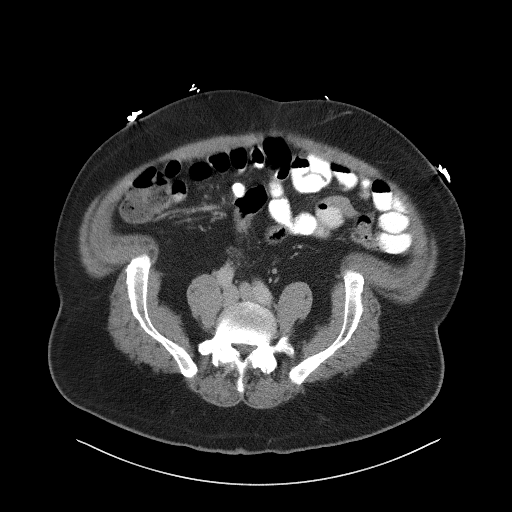
[im 47/88  soft-tissue]
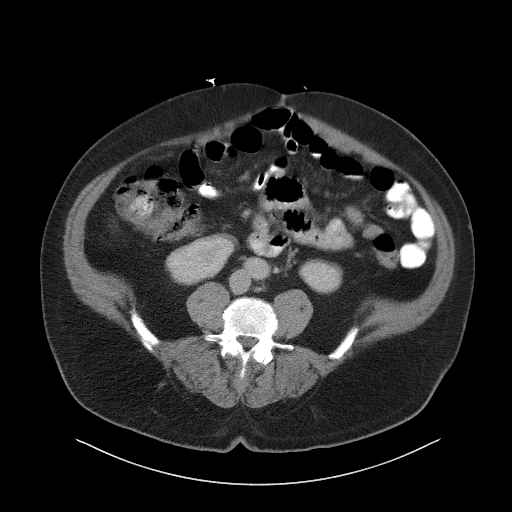
[im 52/88  soft-tissue]
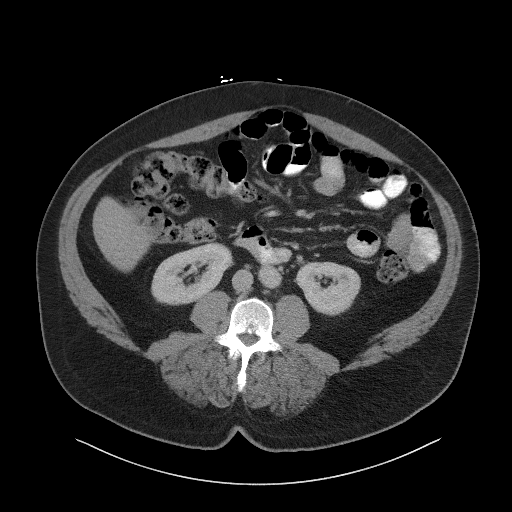
[im 52/88  bone]
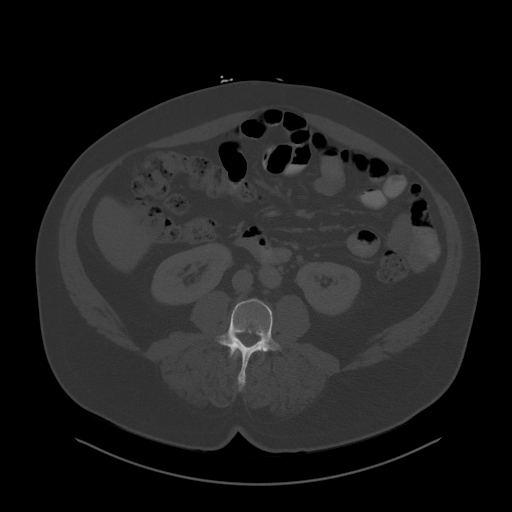
[im 57/88  soft-tissue]
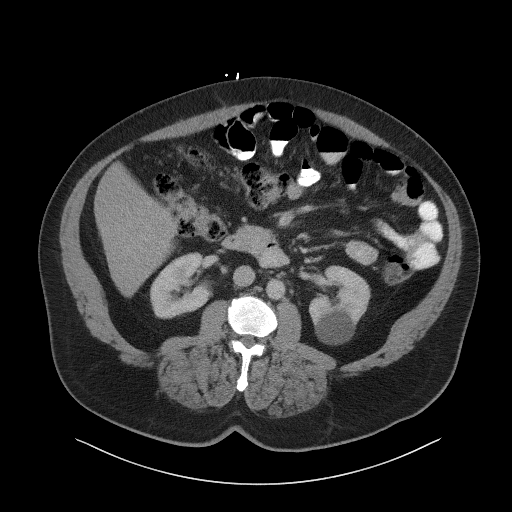
[im 67/88  soft-tissue]
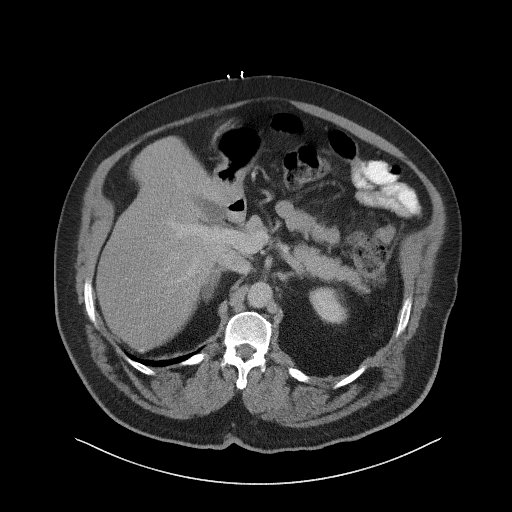
[im 72/88  soft-tissue]
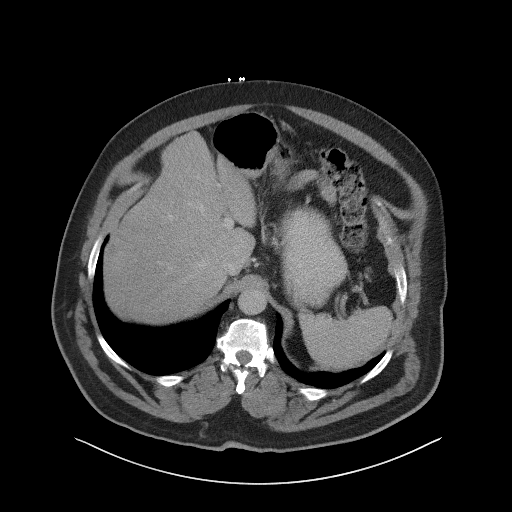
[im 77/88  soft-tissue]
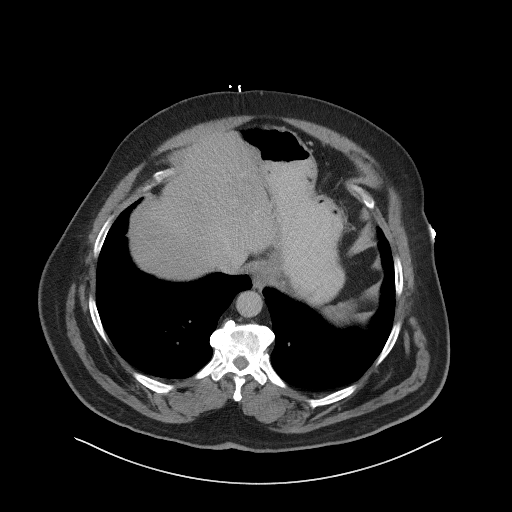
[im 82/88  soft-tissue]
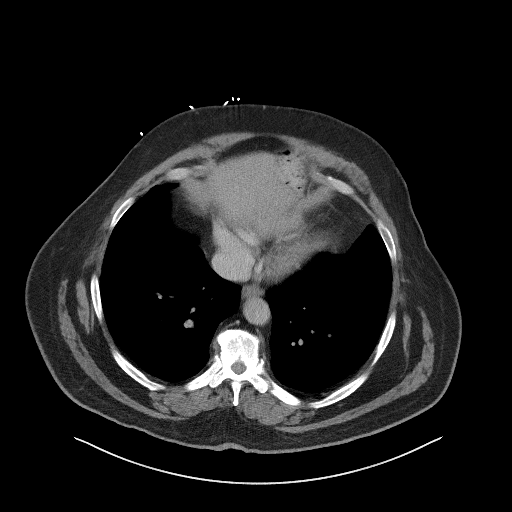

[Series 5: coronal st · coronal · 0.86mm/px · 3 of 125 slices shown]
[im 42/125  soft-tissue]
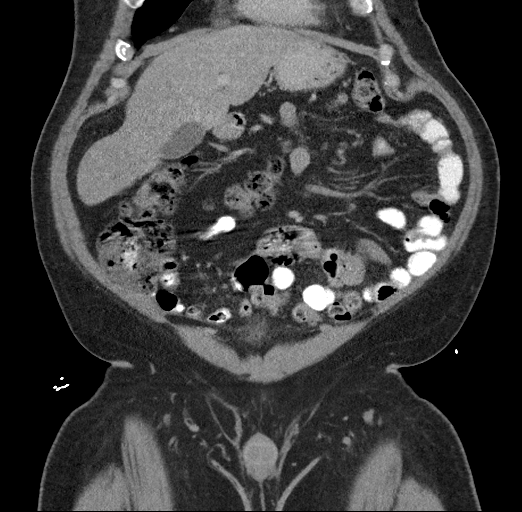
[im 56/125  soft-tissue]
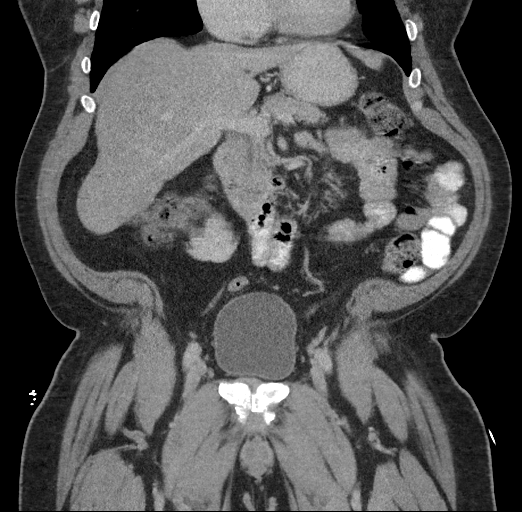
[im 69/125  soft-tissue]
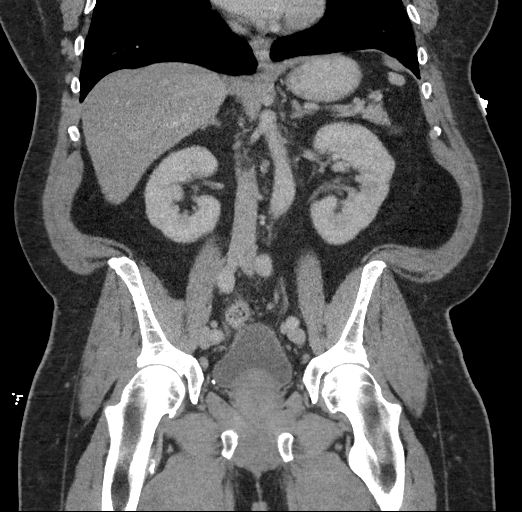

[17 of 46 positions shown; findings below may reference images not displayed]

FINDINGS: Lower chest: Lung bases are clear.

Hepatobiliary: No focal hepatic lesion. No biliary duct dilatation.
Common bile duct is normal.

Pancreas: Pancreas is normal. No ductal dilatation. No pancreatic
inflammation.

Spleen: Normal spleen

Adrenals/urinary tract: Adrenal glands and kidneys are normal. The
ureters and bladder normal.

Stomach/Bowel: Stomach, duodenum small-bowel normal. Post
appendectomy. Terminal ileum normal. The ascending, transverse and
descending colon normal. Normal small volume stool. No evidence of
bowel obstruction. Rectosigmoid colon normal.

Vascular/Lymphatic: Abdominal aorta is normal caliber. No periportal
or retroperitoneal adenopathy. No pelvic adenopathy.

Reproductive: Prostate unremarkable

Other: RIGHT fat filled inguinal hernia measures 2 cm in diameter.

Musculoskeletal: No aggressive osseous lesion.
IMPRESSION: 1. No acute findings in the abdomen pelvis. No bowel obstruction.
Minimal stool volume.
2. Post appendectomy.
3. Small RIGHT fat filled inguinal hernia.

## 2022-06-10 ENCOUNTER — Other Ambulatory Visit: Payer: Self-pay | Admitting: Family Medicine

## 2022-06-10 DIAGNOSIS — Z1211 Encounter for screening for malignant neoplasm of colon: Secondary | ICD-10-CM

## 2024-02-13 NOTE — Progress Notes (Deleted)
 "  02/14/2024 9:06 PM   William Banks [birth year] [id number]  Referring provider: Sampson Ethridge LABOR, MD [address] [place],  [place] [address]  No chief complaint on file.   HPI: William Banks is a 69 y.o. male referred for evaluation of an elevated PSA.  PSA levels: 64.5 on 01/19/2024 Baseline PSA level: None Lower urinary tract symptoms: Family history prostate cancer first-degree relatives: Past urologic history:  PMH: Past Medical History:  Diagnosis Date   Anxiety     Surgical History: *** The histories are not reviewed yet. Please review them in the History navigator section and refresh this SmartLink.  Home Medications:  Allergies as of 02/14/2024       Reactions   Bee Venom Swelling        Medication List        Accurate as of February 13, 2024  9:06 PM. If you have any questions, ask your nurse or doctor.          dicyclomine  10 MG capsule Commonly known as: Bentyl  Take 1 capsule (10 mg total) by mouth 3 (three) times daily as needed for up to 14 days for spasms.   polyethylene glycol powder 17 GM/SCOOP powder Commonly known as: GLYCOLAX /MIRALAX  Take 17 g by mouth daily as needed for moderate constipation.        Allergies: Allergies[1]  Family History: No family history on file.  Social History:  reports that he has quit smoking. He has never used smokeless tobacco. No history on file for alcohol use and drug use.   Physical Exam: There were no vitals taken for this visit.  Constitutional:  Alert and oriented, No acute distress. HEENT:  AT Respiratory: Normal respiratory effort, no increased work of breathing. GU: Prostate Psychiatric: Normal mood and affect.  Laboratory Data: Lab Results  Component Value Date   WBC 6.2 12/29/2019   HGB 13.9 12/29/2019   HCT 42.5 12/29/2019   MCV 87.6 12/29/2019   PLT 419 (H) 12/29/2019    Lab Results  Component Value Date   CREATININE 0.78 12/29/2019    No results found  for: PSA  No results found for: TESTOSTERONE  No results found for: HGBA1C  Urinalysis    Component Value Date/Time   COLORURINE YELLOW (A) 12/29/2019 0734   APPEARANCEUR HAZY (A) 12/29/2019 0734   APPEARANCEUR Clear 08/05/2012 1945   LABSPEC 1.026 12/29/2019 0734   LABSPEC 1.014 08/05/2012 1945   PHURINE 5.0 12/29/2019 0734   GLUCOSEU NEGATIVE 12/29/2019 0734   GLUCOSEU Negative 08/05/2012 1945   HGBUR LARGE (A) 12/29/2019 0734   BILIRUBINUR NEGATIVE 12/29/2019 0734   BILIRUBINUR Negative 08/05/2012 1945   KETONESUR NEGATIVE 12/29/2019 0734   PROTEINUR 30 (A) 12/29/2019 0734   NITRITE NEGATIVE 12/29/2019 0734   LEUKOCYTESUR NEGATIVE 12/29/2019 0734   LEUKOCYTESUR Negative 08/05/2012 1945    Lab Results  Component Value Date   BACTERIA NONE SEEN 12/29/2019    Pertinent Imaging: *** No results found for this or any previous visit.  No results found for this or any previous visit.  No results found for this or any previous visit.  No results found for this or any previous visit.  No results found for this or any previous visit.  No results found for this or any previous visit.  No results found for this or any previous visit.  No results found for this or any previous visit.   Assessment & Plan:    1.  Elevated PSA Although PSA  is a prostate cancer screening test he was informed that cancer is not the most common cause of an elevated PSA. Other potential causes including BPH and inflammation were discussed.  He was informed that the only way to adequately diagnose prostate cancer would be transrectal ultrasound and biopsy of the prostate. The procedure was discussed including potential risks of bleeding and infection/sepsis. He was also informed that a negative biopsy does not conclusively rule out the possibility that prostate cancer may be present and that continued monitoring is required.  The use of newer adjunctive blood and urine tests to predict the  probability of high-grade prostate cancer were discussed. The use of multiparametric prostate MRI to evaluate for lesions suspicious for high grade prostate cancer and aid in targeted bx was reviewed.  Continued periodic surveillance was also discussed.   No follow-ups on file.  [name], MD  [facility] [address] [place], [place] [address] [phone]     [1]  Allergies Allergen Reactions   Bee Venom Swelling   "

## 2024-02-14 ENCOUNTER — Encounter: Payer: Self-pay | Admitting: Urology

## 2024-02-14 ENCOUNTER — Ambulatory Visit: Admitting: Urology

## 2024-02-14 DIAGNOSIS — R972 Elevated prostate specific antigen [PSA]: Secondary | ICD-10-CM

## 2024-02-24 ENCOUNTER — Ambulatory Visit (INDEPENDENT_AMBULATORY_CARE_PROVIDER_SITE_OTHER): Admitting: Urology

## 2024-02-24 VITALS — BP 156/92 | HR 71 | Ht 67.0 in

## 2024-02-24 DIAGNOSIS — R972 Elevated prostate specific antigen [PSA]: Secondary | ICD-10-CM

## 2024-02-24 DIAGNOSIS — Z125 Encounter for screening for malignant neoplasm of prostate: Secondary | ICD-10-CM

## 2024-02-24 NOTE — Patient Instructions (Addendum)
 Understanding PSA Screening  What is PSA Screening? PSA stands for Prostate-Specific Antigen. It is a protein made by the prostate gland.  PSA is made by normal prostate tissue, but can be elevated in patients with prostate cancer.  There are other factors that can cause an increased PSA besides prostate cancer including an enlarged prostate(BPH), recent ejaculation, infection(prostatitis), inflammation, recent illness or procedure.  A normal PSA level is less than 4 for men under age 55.  Why is PSA Screening Important? Prostate cancer is a common cancer in men, by finding prostate cancer early before patients have symptoms we can often cure this with either surgery or radiation.  Some prostate cancers grow very slowly and do not need any intervention and can be safely monitored(active surveillance).  Our goal is to find those more aggressive prostate cancers that if untreated would spread outside the prostate and cause symptoms or death.  If prostate cancer spreads outside the prostate, it cannot usually be cured, but multiple treatments are available to slow the growth of cancer and prolong life.  Who Should Get Tested? Most patients should start PSA testing around age 78 or 68, however high risk patients with a family history of prostate cancer, African American descent, patients with a strong family history of breast cancer should consider screening earlier starting at age 29. After age 48, the risks of screening start outweigh the benefits, and routine screening is not recommended after age 12.  This is because even if you develop prostate cancer in your 51s, 63s, or 90s it tends to grow so slowly that it would not cause symptoms or problems.  What Are the Benefits?    Early detection of prostate cancer More treatment options if cancer is found  Options if you have an elevated PSA: First, a confirmatory second PSA level will always be checked to confirm elevation, as false elevations are  common and we want to avoid any invasive testing if possible If you have 2 elevated PSA levels, options include:  PHI(prostate health index) score: (fancy PSA blood test), this can help determine your risk of prostate cancer if your PSA is mildly elevated between 4 and 10 before moving to more expensive/invasive testing Prostate MRI: Imaging test that can detect areas suspicious for prostate cancer, if MRI is completely normal can potentially avoid a prostate biopsy Prostate biopsy: 10 to 15-minute procedure performed in clinic, can be uncomfortable, 1 to 2% chance of serious bleeding or infection, best test to determine if prostate cancer is present but more invasive   What Are the Risks or Downsides?    Prostate biopsy can be uncomfortable with a small risk of bleeding or infection Some prostate cancers grow very slowly and might never cause problems, and detection may lead to unnecessary treatments Possible side effects from follow-up tests or treatments  -------------------------------------------------------------------------------  Prostate Biopsy Instructions  Stop all aspirin or blood thinners (aspirin, plavix, coumadin, warfarin, motrin, ibuprofen, advil, aleve, naproxen, naprosyn) for 7 days prior to the procedure.  If you have any questions about stopping these medications, please contact your primary care physician or cardiologist.  Having a light meal prior to the procedure is recommended.  If you are diabetic or have low blood sugar please bring a small snack or glucose tablet.  A Fleets enema is needed to be purchased over the counter at a local pharmacy and used 2 hours before you scheduled appointment.  This can be purchased over the counter at any pharmacy.  Antibiotics will  be administered in the clinic at the time of the procedure unless otherwise specified.    Please bring someone with you to the procedure to drive you home.  A follow up appointment has been  scheduled for you to receive the results of the biopsy.  If you have any questions or concerns, please feel free to call the office at [phone] or send a Mychart message.    Thank you, Staff at [facility]

## 2024-02-24 NOTE — Progress Notes (Signed)
" ° °  02/24/24 3:40 PM   Kasch LITTIE Lax 02-20-1955 [id number]  CC: Elevated PSA  HPI: 69 year old male here with some friends today who help provide the history referred for an elevated PSA of 65.  No prior PSA values to review.  CT in 2021 showed no urologic abnormalities.  He denies any urinary symptoms, gross hematuria, weight loss, or bone pain.  History of abdominal surgery for ruptured appendix.   PMH: Past Medical History:  Diagnosis Date   Anxiety     Family History: No family history on file.  Social History:  reports that he has quit smoking. He has never used smokeless tobacco. No history on file for alcohol use and drug use.  Physical Exam: BP (!) 156/92 (BP Location: Left Arm, Patient Position: Sitting, Cuff Size: Large)   Pulse 71   Ht 5' 7 (1.702 m)   SpO2 95%   BMI 33.67 kg/m    Constitutional:  Alert and oriented, No acute distress. Cardiovascular: No clubbing, cyanosis, or edema. Respiratory: Normal respiratory effort, no increased work of breathing. GI: Abdomen is soft, nontender, nondistended, no abdominal masses   Laboratory Data: Single PSA value of 65   Assessment & Plan:   69 year old male with single elevated PSA value of 65.  We reviewed the implications of an elevated PSA and the uncertainty surrounding it. In general, a man's PSA increases with age and is produced by both normal and cancerous prostate tissue. The differential diagnosis for elevated PSA includes BPH, prostate cancer, infection/prostatitis, recent intercourse/ejaculation, recent urethroscopic manipulation (foley placement/cystoscopy) or trauma. Management of an elevated PSA can include observation/surveillance, prostate MRI, or prostate biopsy and we discussed this in detail. Our goal is to detect clinically significant prostate cancers, and manage with either active surveillance, surgery, or radiation for localized disease. Risks of prostate biopsy include bleeding, infection  (including life threatening sepsis), pain, and lower urinary symptoms. Hematuria, hematospermia, and blood in the stool are all common after biopsy and can persist up to 4 weeks.  Repeat PSA today, if remains elevated will schedule prostate biopsy.  Will need Valium  prior to prostate biopsy   Redell Burnet, MD 02/24/2024  [facility] [address] [place], [place] [address] [phone]   "

## 2024-02-25 ENCOUNTER — Ambulatory Visit: Payer: Self-pay | Admitting: Urology

## 2024-02-25 ENCOUNTER — Other Ambulatory Visit: Payer: Self-pay | Admitting: Urology

## 2024-02-25 LAB — PSA: Prostate Specific Ag, Serum: 64.7 ng/mL — ABNORMAL HIGH (ref 0.0–4.0)

## 2024-02-25 MED ORDER — DIAZEPAM 10 MG PO TABS: 10.0000 mg | ORAL_TABLET | Freq: Once | ORAL | 0 refills | Status: AC | PRN

## 2024-02-25 NOTE — Progress Notes (Signed)
 Will need prostate biopsy for elevated PSA of 65, Valium  sent, will schedule  Redell Burnet, MD 02/25/2024

## 2024-03-21 ENCOUNTER — Other Ambulatory Visit: Admitting: Urology
